# Patient Record
Sex: Male | Born: 2000 | Race: White | Hispanic: No | Marital: Single
Health system: Southern US, Community
[De-identification: ages and names within clinical notes are randomized; demographics above are authoritative.]

## PROBLEM LIST (undated history)

## (undated) DIAGNOSIS — F909 Attention-deficit hyperactivity disorder, unspecified type: Secondary | ICD-10-CM

## (undated) HISTORY — PX: TONSILLECTOMY: SUR1361

---
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
## 2018-01-02 ENCOUNTER — Emergency Department (HOSPITAL_COMMUNITY)
Admission: EM | Admit: 2018-01-02 | Discharge: 2018-01-02 | Disposition: A | Discharge status: Home / Self Care | Payer: 59 | Attending: Emergency Medicine | Admitting: Emergency Medicine

## 2018-01-02 ENCOUNTER — Emergency Department (HOSPITAL_COMMUNITY): Payer: 59

## 2018-01-02 ENCOUNTER — Other Ambulatory Visit: Payer: Self-pay

## 2018-01-02 ENCOUNTER — Encounter (HOSPITAL_COMMUNITY): Payer: Self-pay | Admitting: Emergency Medicine

## 2018-01-02 DIAGNOSIS — Y929 Unspecified place or not applicable: Secondary | ICD-10-CM | POA: Diagnosis not present

## 2018-01-02 DIAGNOSIS — X500XXA Overexertion from strenuous movement or load, initial encounter: Secondary | ICD-10-CM | POA: Insufficient documentation

## 2018-01-02 DIAGNOSIS — Z7722 Contact with and (suspected) exposure to environmental tobacco smoke (acute) (chronic): Secondary | ICD-10-CM | POA: Insufficient documentation

## 2018-01-02 DIAGNOSIS — Z79899 Other long term (current) drug therapy: Secondary | ICD-10-CM | POA: Insufficient documentation

## 2018-01-02 DIAGNOSIS — Y9389 Activity, other specified: Secondary | ICD-10-CM | POA: Diagnosis not present

## 2018-01-02 DIAGNOSIS — S46911A Strain of unspecified muscle, fascia and tendon at shoulder and upper arm level, right arm, initial encounter: Secondary | ICD-10-CM | POA: Diagnosis not present

## 2018-01-02 DIAGNOSIS — Y999 Unspecified external cause status: Secondary | ICD-10-CM | POA: Insufficient documentation

## 2018-01-02 DIAGNOSIS — S4991XA Unspecified injury of right shoulder and upper arm, initial encounter: Secondary | ICD-10-CM | POA: Diagnosis present

## 2018-01-02 MED ORDER — IBUPROFEN 600 MG PO TABS: 600.0000 mg | ORAL_TABLET | Freq: Four times a day (QID) | ORAL | 0 refills | Status: AC | PRN

## 2018-01-02 NOTE — ED Triage Notes (Signed)
Pt c/o worsening RT shoulder pain since last Wednesday after he injured it lifting a cooler. No obvious deformity noted. No relief from Tylenol or Aleve.

## 2018-01-02 NOTE — Discharge Instructions (Addendum)
Apply ice packs on/off to your shoulder.  Call one of the orthopedic providers listed to arrange a follow-up appt. In a few days if not improving

## 2018-01-02 NOTE — ED Provider Notes (Signed)
[facility] EMERGENCY DEPARTMENT Provider Note   CSN: [id number] Arrival date & time: 01/02/18  1306     History   Chief Complaint Chief Complaint  Patient presents with  . Shoulder Injury    HPI Luke Ortiz is a 17 y.o. male.  HPI   Luke Ortiz is a 17 y.o. male who presents to the Emergency Department complaining of right shoulder pain for nearly 1 week.  States his pain began after picking up a cooler with one hand.  He describes an aching pain associated with movement of his right arm.  The pain is located along his right scapula and extends to the front of his shoulder and into his armpit.  He has been applying ice, taking Tylenol and Aleve without relief.  Pain to his shoulder resolves with his arm resting at his side.  He denies neck pain, rib pain, numbness or weakness of his right arm or pain distal to the shoulder.   Past Medical History:  Diagnosis Date  . ADHD (attention deficit hyperactivity disorder)     There are no active problems to display for this patient.   Past Surgical History:  Procedure Laterality Date  . ADENOIDECTOMY  2011  . FEMUR SURGERY  2004  . TONSILLECTOMY          Home Medications    Prior to Admission medications   Medication Sig Start Date End Date Taking? Authorizing Provider  acetaminophen (TYLENOL) 325 MG tablet Take 650 mg by mouth every 6 (six) hours as needed.    [provider]  aluminum chloride (DRYSOL) 20 % external solution Apply topically at bedtime. 06/21/16   Asencion Islam, DPM  amoxicillin-clavulanate (AUGMENTIN) 875-125 MG tablet Take 1 tablet by mouth 2 (two) times daily. 06/21/16   Asencion Islam, DPM  bacitracin ointment Apply 1 application topically 2 (two) times daily. 03/07/15   Antony Madura, PA-C  cephALEXin (KEFLEX) 500 MG capsule Take 1 capsule (500 mg total) by mouth 4 (four) times daily. 03/07/15   Antony Madura, PA-C  GuanFACINE HCl (INTUNIV) 3 MG TB24 Take 3 mg by mouth daily. Take on  school days    [provider]  ibuprofen (ADVIL,MOTRIN) 600 MG tablet Take 1 tablet (600 mg total) by mouth every 6 (six) hours as needed. Take with food 01/02/18   Pauline Aus, PA-C    Family History No family history on file.  Social History Social History   Tobacco Use  . Smoking status: Passive Smoke Exposure - Never Smoker  . Smokeless tobacco: Never Used  Substance Use Topics  . Alcohol use: No  . Drug use: No     Allergies   Patient has no known allergies.   Review of Systems Review of Systems  Constitutional: Negative for chills and fever.  HENT: Negative for sore throat and trouble swallowing.   Respiratory: Negative for shortness of breath.   Cardiovascular: Negative for chest pain.  Gastrointestinal: Negative for abdominal pain and vomiting.  Musculoskeletal: Positive for arthralgias (Right shoulder pain). Negative for joint swelling, neck pain and neck stiffness.  Skin: Negative for color change and wound.  Neurological: Negative for dizziness, weakness, numbness and headaches.  All other systems reviewed and are negative.    Physical Exam Updated Vital Signs BP 124/65 (BP Location: Left Arm)   Pulse (!) 51   Temp 98.2 F (36.8 C) (Oral)   Resp 20   Ht 5\' 10"  (1.778 m)   Wt 67.6 kg (149 lb)  SpO2 100%   BMI 21.38 kg/m   Physical Exam  Constitutional: He appears well-developed and well-nourished. No distress.  HENT:  Mouth/Throat: Oropharynx is clear and moist.  Neck: Normal range of motion and phonation normal. No spinous process tenderness and no muscular tenderness present. Normal range of motion present.  Cardiovascular: Normal rate, regular rhythm and intact distal pulses.  Pulmonary/Chest: Effort normal. No respiratory distress. He exhibits no tenderness.  Musculoskeletal:       Right shoulder: He exhibits tenderness. He exhibits normal range of motion, no swelling, no effusion, no crepitus, no deformity, normal pulse and normal  strength.       Back:       Arms: Tenderness palpation of the musculature surrounding the right scapular border and extending into the axilla.  No bony tenderness or edema.  Pain of the shoulder reproduced with abduction of the right arm.  Neurological: He is alert. No sensory deficit.  Skin: Skin is warm. Capillary refill takes less than 2 seconds. No rash noted.  Nursing note and vitals reviewed.    ED Treatments / Results  Labs (all labs ordered are listed, but only abnormal results are displayed) Labs Reviewed - No data to display  EKG None  Radiology Dg Shoulder Right  Result Date: 01/02/2018 CLINICAL DATA:  Right shoulder pain for 1 week after injury lifting a cooler. Initial encounter. EXAM: RIGHT SHOULDER - 2+ VIEW COMPARISON:  None. FINDINGS: Negative for shoulder fracture or dislocation. Normal alignment at the acromioclavicular joint. Apparent incomplete lucency through the medial right first rib is likely summation shadows with the scapula. IMPRESSION: Negative right shoulder series. Electronically Signed   By: Marnee SpringJonathon  Watts M.D.   On: 01/02/2018 14:19    Procedures Procedures (including critical care time)  Medications Ordered in ED Medications - No data to display   Initial Impression / Assessment and Plan / ED Course  I have reviewed the triage vital signs and the nursing notes.  Pertinent labs & imaging results that were available during my care of the patient were reviewed by me and considered in my medical decision making (see chart for details).     Patient well-appearing.  No motor weakness on exam.  Neurovascularly intact.  X-ray negative for bony injury.  I feel this is likely related to a strain.  I have discussed the possibility of a rotator cuff injury.  He agrees to symptomatic treatment with ice, rest, and NSAID and referral to orthopedics.    Final Clinical Impressions(s) / ED Diagnoses   Final diagnoses:  Strain of right shoulder, initial  encounter    ED Discharge Orders        Ordered    ibuprofen (ADVIL,MOTRIN) 600 MG tablet  Every 6 hours PRN     01/02/18 1443       Pauline Ausriplett, Shatima Zalar, PA-C 01/02/18 1457    Pricilla LovelessGoldston, Scott, MD 01/02/18 1600

## 2018-01-04 ENCOUNTER — Ambulatory Visit (INDEPENDENT_AMBULATORY_CARE_PROVIDER_SITE_OTHER): Payer: 59 | Admitting: Physician Assistant

## 2018-01-04 ENCOUNTER — Ambulatory Visit (INDEPENDENT_AMBULATORY_CARE_PROVIDER_SITE_OTHER): Payer: BLUE CROSS/BLUE SHIELD | Admitting: Physician Assistant

## 2018-01-04 ENCOUNTER — Encounter (INDEPENDENT_AMBULATORY_CARE_PROVIDER_SITE_OTHER): Payer: Self-pay | Admitting: Physician Assistant

## 2018-01-04 DIAGNOSIS — M25511 Pain in right shoulder: Secondary | ICD-10-CM | POA: Diagnosis not present

## 2018-01-04 NOTE — Progress Notes (Signed)
   Office Visit Note   Patient: Luke Ortiz           Date of Birth: 06/03/2001           MRN: [id number] Visit Date: 01/04/2018              Requested by: Blair HeysEhinger, Robert, MD [address] [place], [place] [address] PCP: Blair HeysEhinger, Robert, MD   Assessment & Plan: Visit Diagnoses:  1. Acute pain of right shoulder     Plan: Impression is muscle strain to the right shoulder.  At this point, the patient's symptoms are very vague.  I will have him rest and apply ice as well as use scheduled Motrin for the next week.  He will follow-up with us in 1 week's time.  At that point we may obtain an MRI to further assess this.  Call with concerns or questions in the meantime.  Follow-Up Instructions: Return in about 5 days (around 01/09/2018).   Orders:  No orders of the defined types were placed in this encounter.  No orders of the defined types were placed in this encounter.     Procedures: No procedures performed   Clinical Data: No additional findings.   Subjective: Chief Complaint  Patient presents with  . Right Shoulder - Pain, Follow-up    HPI patient is a pleasant 17 year old right-hand-dominant boy who comes in today following an injury to his right shoulder.  Approximately 1 week ago he was lifting a heavy cooler out of the truck.  Pain to the right axilla started a few days after.  The pain he has hurts with all motions of the shoulder.  No pain to the chest wall.  He was seen in the ED yesterday where x-rays were obtained.  These were negative for acute findings.  He has been taking occasional NSAIDs with minimal relief of symptoms.  No numbness, tingling or burning.  No weakness.  Review of Systems as detailed in HPI.  All others reviewed and are negative.   Objective: Vital Signs: There were no vitals taken for this visit.  Physical Exam well-developed well-nourished boy in no acute distress.  Alert and oriented x3.  Ortho Exam examination of his right  shoulder reveals full active range of motion in all planes.  Negative empty can test negative O'Briens and speeds test.  No pain over his peck and no palpable abnormality.  Negative sulcus.  Full strength with resisted internal and external rotation.  He is neurovascularly intact distally.  Specialty Comments:  No specialty comments available.  Imaging: No new imaging   PMFS History: Patient Active Problem List   Diagnosis Date Noted  . Acute pain of right shoulder 01/04/2018   Past Medical History:  Diagnosis Date  . ADHD (attention deficit hyperactivity disorder)     History reviewed. No pertinent family history.  Past Surgical History:  Procedure Laterality Date  . ADENOIDECTOMY  2011  . FEMUR SURGERY  2004  . TONSILLECTOMY     Social History   Occupational History  . Not on file  Tobacco Use  . Smoking status: Passive Smoke Exposure - Never Smoker  . Smokeless tobacco: Never Used  Substance and Sexual Activity  . Alcohol use: No  . Drug use: No  . Sexual activity: Not on file

## 2018-01-09 ENCOUNTER — Ambulatory Visit (INDEPENDENT_AMBULATORY_CARE_PROVIDER_SITE_OTHER): Payer: 59 | Admitting: Orthopaedic Surgery

## 2018-01-09 ENCOUNTER — Encounter (INDEPENDENT_AMBULATORY_CARE_PROVIDER_SITE_OTHER): Payer: Self-pay | Admitting: Orthopaedic Surgery

## 2018-01-09 DIAGNOSIS — M25511 Pain in right shoulder: Secondary | ICD-10-CM | POA: Diagnosis not present

## 2018-01-09 NOTE — Progress Notes (Signed)
   Office Visit Note   Patient: Luke Ortiz           Date of Birth: 07/13/2001           MRN: [id number] Visit Date: 01/09/2018              Requested by: Blair HeysEhinger, Robert, MD [address] [place], [place] [address] PCP: Blair HeysEhinger, Robert, MD   Assessment & Plan: Visit Diagnoses:  1. Acute pain of right shoulder     Plan: Impression shoulder sprain.  I recommend physical therapy for strengthening and home exercise program.  Questions encouraged and answered.  Recheck in 6 weeks.  Follow-Up Instructions: Return in about 6 weeks (around 02/20/2018).   Orders:  No orders of the defined types were placed in this encounter.  No orders of the defined types were placed in this encounter.     Procedures: No procedures performed   Clinical Data: No additional findings.   Subjective: Chief Complaint  Patient presents with  . Right Shoulder - Pain, Follow-up    Luke Ortiz follows up for his right shoulder pain.  He feels better than last visit.  He is not taking any pain medicines.  His pain is mainly posterior.  He feels like there is some popping.  Denies any numbness and tingling.   Review of Systems  Constitutional: Negative.   All other systems reviewed and are negative.    Objective: Vital Signs: There were no vitals taken for this visit.  Physical Exam  Constitutional: He is oriented to person, place, and time. He appears well-developed and well-nourished.  Pulmonary/Chest: Effort normal.  Abdominal: Soft.  Neurological: He is alert and oriented to person, place, and time.  Skin: Skin is warm.  Psychiatric: He has a normal mood and affect. His behavior is normal. Judgment and thought content normal.  Nursing note and vitals reviewed.   Ortho Exam Right shoulder exam shows negative apprehension.  Rotator cuff and labral testing are normal. Specialty Comments:  No specialty comments available.  Imaging: No results found.   PMFS  History: Patient Active Problem List   Diagnosis Date Noted  . Acute pain of right shoulder 01/04/2018   Past Medical History:  Diagnosis Date  . ADHD (attention deficit hyperactivity disorder)     History reviewed. No pertinent family history.  Past Surgical History:  Procedure Laterality Date  . ADENOIDECTOMY  2011  . FEMUR SURGERY  2004  . TONSILLECTOMY     Social History   Occupational History  . Not on file  Tobacco Use  . Smoking status: Passive Smoke Exposure - Never Smoker  . Smokeless tobacco: Never Used  Substance and Sexual Activity  . Alcohol use: No  . Drug use: No  . Sexual activity: Not on file

## 2018-02-15 ENCOUNTER — Ambulatory Visit (INDEPENDENT_AMBULATORY_CARE_PROVIDER_SITE_OTHER): Payer: 59 | Admitting: Orthopaedic Surgery

## 2018-05-28 ENCOUNTER — Encounter (INDEPENDENT_AMBULATORY_CARE_PROVIDER_SITE_OTHER): Payer: Self-pay | Admitting: Family Medicine

## 2018-05-28 ENCOUNTER — Ambulatory Visit (INDEPENDENT_AMBULATORY_CARE_PROVIDER_SITE_OTHER): Payer: Self-pay

## 2018-05-28 ENCOUNTER — Ambulatory Visit (INDEPENDENT_AMBULATORY_CARE_PROVIDER_SITE_OTHER): Payer: 59 | Admitting: Family Medicine

## 2018-05-28 DIAGNOSIS — M25562 Pain in left knee: Secondary | ICD-10-CM | POA: Diagnosis not present

## 2018-05-28 DIAGNOSIS — M545 Low back pain, unspecified: Secondary | ICD-10-CM

## 2018-05-28 MED ORDER — TIZANIDINE HCL 2 MG PO TABS: 2.0000 mg | ORAL_TABLET | Freq: Four times a day (QID) | ORAL | 1 refills | Status: AC | PRN

## 2018-05-28 NOTE — Progress Notes (Signed)
Office Visit Note   Patient: Luke Ortiz           Date of Birth: 08-Dec-2000           MRN: [id number] Visit Date: 05/28/2018 Requested by: Luke Heys, MD [address] [place], [place] [address] PCP: Luke Heys, MD  Subjective: Chief Complaint  Patient presents with  . Lower Back - Pain, Injury    Fell yesterday approx. 10 ft off roof of house, & onto a deck (was blowing leaves from gutters). Fell with rt arm out in front of him & hit lt knee on deck. Leafblower was still on his back.  . Left Knee - Pain, Injury    HPI: He is a 17 year old with low back and left knee pain.  Yesterday he was up on his wrist at his house using a leaf blower.  He slid on his back down the roof, his leaf blower caught on the gutter and flipped him forward.  He landed on his back catching himself with his right arm and landing on his left knee.  The leaf blower jammed into his lower back.  He was able to stand up and walk, he thought everything was okay, but after a couple hours his low back and his knee started throbbing.  He had trouble sleeping last night because of his pain.  No previous problems with his left knee but he has had some minor low back injuries in the past.  Denies any radicular symptoms.              ROS: He is otherwise in good health.  Objective: Vital Signs: There were no vitals taken for this visit.  Physical Exam:  Low back: No bruising or swelling.  He is tender to palpation along the L4 and L5 spinous processes and especially in bilateral paraspinous muscles.  Straight leg raise negative, lower extremity strength and reflexes normal. Left knee: Trace effusion, no warmth or erythema.  No bruising.  Very tender at the distal patella and tibial tubercle, and also a little bit on the medial and lateral femoral condyles.  Lachman's is solid, no laxity with varus/valgus stress.  Extensor mechanism is intact.  McMurray's is negative.  Imaging: X-rays lumbar  spine: Normal anatomic alignment, no congenital deformities, no obvious fracture.  X-rays left knee: Growth plates are closed, normal alignment with no evidence of fracture.    Assessment & Plan: 1.  Lumbar contusion status post fall with suspected paraspinous muscle strain -Light duty work for 1 week, ibuprofen as needed, Zanaflex as needed.  Physical therapy referral if not improving.  2.  Left knee contusion status post fall with probable bone bruise -As above.  Repeat x-rays in 3 to 4 weeks if not improving.   Follow-Up Instructions: No follow-ups on file.      Procedures: No procedures performed  No notes on file    PMFS History: Patient Active Problem List   Diagnosis Date Noted  . Acute pain of right shoulder 01/04/2018   Past Medical History:  Diagnosis Date  . ADHD (attention deficit hyperactivity disorder)     History reviewed. No pertinent family history.  Past Surgical History:  Procedure Laterality Date  . ADENOIDECTOMY  2011  . FEMUR SURGERY  2004  . TONSILLECTOMY     Social History   Occupational History  . Not on file  Tobacco Use  . Smoking status: Passive Smoke Exposure - Never Smoker  . Smokeless tobacco:  Never Used  Substance and Sexual Activity  . Alcohol use: No  . Drug use: No  . Sexual activity: Not on file
